# Patient Record
Sex: Female | Born: 1990 | Race: Black or African American | Hispanic: No | Marital: Single | Smoking: Current every day smoker
Health system: Southern US, Community
[De-identification: ages and names within clinical notes are randomized; demographics above are authoritative.]

---

## 2013-04-10 ENCOUNTER — Encounter (HOSPITAL_COMMUNITY): Payer: Self-pay | Admitting: *Deleted

## 2013-04-10 ENCOUNTER — Emergency Department (HOSPITAL_COMMUNITY)
Admission: EM | Admit: 2013-04-10 | Discharge: 2013-04-10 | Disposition: A | Discharge status: Home / Self Care | Payer: No Typology Code available for payment source | Attending: Emergency Medicine | Admitting: Emergency Medicine

## 2013-04-10 DIAGNOSIS — S8990XA Unspecified injury of unspecified lower leg, initial encounter: Secondary | ICD-10-CM | POA: Insufficient documentation

## 2013-04-10 DIAGNOSIS — IMO0002 Reserved for concepts with insufficient information to code with codable children: Secondary | ICD-10-CM | POA: Insufficient documentation

## 2013-04-10 DIAGNOSIS — Y92410 Unspecified street and highway as the place of occurrence of the external cause: Secondary | ICD-10-CM | POA: Insufficient documentation

## 2013-04-10 DIAGNOSIS — Y9389 Activity, other specified: Secondary | ICD-10-CM | POA: Insufficient documentation

## 2013-04-10 DIAGNOSIS — S99919A Unspecified injury of unspecified ankle, initial encounter: Secondary | ICD-10-CM | POA: Insufficient documentation

## 2013-04-10 MED ORDER — HYDROCODONE-ACETAMINOPHEN 5-325 MG PO TABS: 1.0000 | ORAL_TABLET | Freq: Four times a day (QID) | ORAL | Status: DC | PRN

## 2013-04-10 MED ORDER — DIAZEPAM 5 MG PO TABS: ORAL_TABLET | ORAL | Status: DC
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
## 2013-04-13 NOTE — ED Provider Notes (Signed)
Medical screening examination/treatment/procedure(s) were performed by non-physician practitioner and as supervising physician I was immediately available for consultation/collaboration.   Ashby Dawes, MD 04/13/13 [phone]

## 2014-05-09 ENCOUNTER — Emergency Department (HOSPITAL_COMMUNITY)
Admission: EM | Admit: 2014-05-09 | Discharge: 2014-05-09 | Disposition: A | Discharge status: Home / Self Care | Payer: No Typology Code available for payment source | Attending: Emergency Medicine | Admitting: Emergency Medicine

## 2014-05-09 ENCOUNTER — Encounter (HOSPITAL_COMMUNITY): Payer: Self-pay | Admitting: Emergency Medicine

## 2014-05-09 ENCOUNTER — Emergency Department (HOSPITAL_COMMUNITY)
Admission: EM | Admit: 2014-05-09 | Discharge: 2014-05-09 | Discharge status: Against Medical Advice | Payer: No Typology Code available for payment source | Attending: Emergency Medicine | Admitting: Emergency Medicine

## 2014-05-09 DIAGNOSIS — A499 Bacterial infection, unspecified: Secondary | ICD-10-CM | POA: Insufficient documentation

## 2014-05-09 DIAGNOSIS — Z202 Contact with and (suspected) exposure to infections with a predominantly sexual mode of transmission: Secondary | ICD-10-CM

## 2014-05-09 DIAGNOSIS — F17200 Nicotine dependence, unspecified, uncomplicated: Secondary | ICD-10-CM | POA: Insufficient documentation

## 2014-05-09 DIAGNOSIS — Z3202 Encounter for pregnancy test, result negative: Secondary | ICD-10-CM | POA: Insufficient documentation

## 2014-05-09 DIAGNOSIS — R05 Cough: Secondary | ICD-10-CM

## 2014-05-09 DIAGNOSIS — N898 Other specified noninflammatory disorders of vagina: Secondary | ICD-10-CM | POA: Insufficient documentation

## 2014-05-09 DIAGNOSIS — N760 Acute vaginitis: Secondary | ICD-10-CM | POA: Insufficient documentation

## 2014-05-09 DIAGNOSIS — R110 Nausea: Secondary | ICD-10-CM | POA: Insufficient documentation

## 2014-05-09 DIAGNOSIS — B9689 Other specified bacterial agents as the cause of diseases classified elsewhere: Secondary | ICD-10-CM | POA: Insufficient documentation

## 2014-05-09 DIAGNOSIS — R1011 Right upper quadrant pain: Secondary | ICD-10-CM | POA: Insufficient documentation

## 2014-05-09 DIAGNOSIS — R197 Diarrhea, unspecified: Secondary | ICD-10-CM | POA: Insufficient documentation

## 2014-05-09 DIAGNOSIS — T361X5A Adverse effect of cephalosporins and other beta-lactam antibiotics, initial encounter: Secondary | ICD-10-CM | POA: Insufficient documentation

## 2014-05-09 DIAGNOSIS — R059 Cough, unspecified: Secondary | ICD-10-CM

## 2014-05-09 LAB — URINALYSIS, ROUTINE W REFLEX MICROSCOPIC
Bilirubin Urine: NEGATIVE
Glucose, UA: NEGATIVE mg/dL
HGB URINE DIPSTICK: NEGATIVE
Ketones, ur: NEGATIVE mg/dL
Leukocytes, UA: NEGATIVE
Nitrite: NEGATIVE
PH: 8.5 — AB (ref 5.0–8.0)
Protein, ur: NEGATIVE mg/dL
SPECIFIC GRAVITY, URINE: 1.019 (ref 1.005–1.030)
UROBILINOGEN UA: 0.2 mg/dL (ref 0.0–1.0)

## 2014-05-09 LAB — WET PREP, GENITAL
TRICH WET PREP: NONE SEEN
Yeast Wet Prep HPF POC: NONE SEEN

## 2014-05-09 LAB — PREGNANCY, URINE: PREG TEST UR: NEGATIVE

## 2014-05-09 LAB — HIV ANTIBODY (ROUTINE TESTING W REFLEX): HIV 1&2 Ab, 4th Generation: NONREACTIVE

## 2014-05-09 MED ORDER — METRONIDAZOLE 500 MG PO TABS: 500.0000 mg | ORAL_TABLET | Freq: Two times a day (BID) | ORAL | Status: DC

## 2014-05-09 MED ORDER — CEFTRIAXONE SODIUM 250 MG IJ SOLR
250.0000 mg | Freq: Once | INTRAMUSCULAR | Status: AC
  Administered 2014-05-09: 250 mg via INTRAMUSCULAR
  Filled 2014-05-09: qty 250

## 2014-05-09 MED ORDER — AZITHROMYCIN 250 MG PO TABS
1000.0000 mg | ORAL_TABLET | Freq: Once | ORAL | Status: AC
  Administered 2014-05-09: 1000 mg via ORAL
  Filled 2014-05-09: qty 4

## 2014-05-09 MED ORDER — ONDANSETRON 4 MG PO TBDP
8.0000 mg | ORAL_TABLET | Freq: Once | ORAL | Status: AC
  Administered 2014-05-09: 8 mg via ORAL
  Filled 2014-05-09: qty 2

## 2014-05-09 NOTE — ED Provider Notes (Signed)
CSN: [id number]     Arrival date & time 05/09/14  1109 History   First MD Initiated Contact with Patient 05/09/14 1120     Chief Complaint  Patient presents with  . Cough  . Vaginal Discharge     (Consider location/radiation/quality/duration/timing/severity/associated sxs/prior Treatment) HPI Comments: Pt comes with with 2 complaints. Pt first complaint is cough times one month no fever associated with cough. Denies productive cough. Is a smoker. States that she was also told that she was exposed to chlamydia and she is having vaginal discharge. Denies abdominal pain. Does have history of std a couple of years ago.  The history is provided by the patient. No language interpreter was used.    History reviewed. No pertinent past medical history. History reviewed. No pertinent past surgical history. No family history on file. History  Substance Use Topics  . Smoking status: Current Every Day Smoker -- 0.25 packs/day  . Smokeless tobacco: Not on file  . Alcohol Use: No   OB History   Grav Para Term Preterm Abortions TAB SAB Ect Mult Living                 Review of Systems  Constitutional: Negative.   Respiratory: Negative.   Cardiovascular: Negative.       Allergies  Review of patient's allergies indicates no known allergies.  Home Medications   Prior to Admission medications   Not on File   BP 135/72  Pulse 70  Temp(Src) 98.2 F (36.8 C)  Resp 16  SpO2 98% Physical Exam  Nursing note and vitals reviewed. Constitutional: She is oriented to person, place, and time. She appears well-developed and well-nourished.  HENT:  Head: Normocephalic and atraumatic.  Right Ear: External ear normal.  Left Ear: External ear normal.  Cardiovascular: Normal rate and regular rhythm.   Pulmonary/Chest: Effort normal and breath sounds normal.  Abdominal: Soft. Bowel sounds are normal. There is no tenderness.  Genitourinary:  Clear vaginal discharge.no cmt  Musculoskeletal:  Normal range of motion.  Neurological: She is alert and oriented to person, place, and time. Coordination normal.  Skin: Skin is warm and dry.    ED Course  Procedures (including critical care time) Labs Review Labs Reviewed  WET PREP, GENITAL - Abnormal; Notable for the following:    Clue Cells Wet Prep HPF POC FEW (*)    WBC, Wet Prep HPF POC FEW (*)    All other components within normal limits  URINALYSIS, ROUTINE W REFLEX MICROSCOPIC - Abnormal; Notable for the following:    APPearance HAZY (*)    pH 8.5 (*)    All other components within normal limits  GC/CHLAMYDIA PROBE AMP  PREGNANCY, URINE  HIV ANTIBODY (ROUTINE TESTING)    Imaging Review No results found.   EKG Interpretation None      MDM   Final diagnoses:  BV (bacterial vaginosis)  STD exposure  Cough    Will treat for bv with flagyl. Pt given zithromax and rocpehin as known exposure. Pt is okay to follow up as needed.    Teressa LowerVrinda Khai Arrona, NP 05/09/14 [phone]

## 2014-05-09 NOTE — ED Notes (Addendum)
Cough x a few weeks worse at night also states wants to be checked for std partner states she has chlymdia

## 2014-05-09 NOTE — ED Notes (Signed)
Pt called for room X 3. No answer.

## 2014-05-09 NOTE — Discharge Instructions (Signed)
Bacterial Vaginosis Bacterial vaginosis is a vaginal infection that occurs when the normal balance of bacteria in the vagina is disrupted. It results from an overgrowth of certain bacteria. This is the most common vaginal infection in women of childbearing age. Treatment is important to prevent complications, especially in pregnant women, as it can cause a premature delivery. CAUSES  Bacterial vaginosis is caused by an increase in harmful bacteria that are normally present in smaller amounts in the vagina. Several different kinds of bacteria can cause bacterial vaginosis. However, the reason that the condition develops is not fully understood. RISK FACTORS Certain activities or behaviors can put you at an increased risk of developing bacterial vaginosis, including:  Having a new sex partner or multiple sex partners.  Douching.  Using an intrauterine device (IUD) for contraception. Women do not get bacterial vaginosis from toilet seats, bedding, swimming pools, or contact with objects around them. SIGNS AND SYMPTOMS  Some women with bacterial vaginosis have no signs or symptoms. Common symptoms include:  Grey vaginal discharge.  A fishlike odor with discharge, especially after sexual intercourse.  Itching or burning of the vagina and vulva.  Burning or pain with urination. DIAGNOSIS  Your health care provider will take a medical history and examine the vagina for signs of bacterial vaginosis. A sample of vaginal fluid may be taken. Your health care provider will look at this sample under a microscope to check for bacteria and abnormal cells. A vaginal pH test may also be done.  TREATMENT  Bacterial vaginosis may be treated with antibiotic medicines. These may be given in the form of a pill or a vaginal cream. A second round of antibiotics may be prescribed if the condition comes back after treatment.  HOME CARE INSTRUCTIONS   Only take over-the-counter or prescription medicines as  directed by your health care provider.  If antibiotic medicine was prescribed, take it as directed. Make sure you finish it even if you start to feel better.  Do not have sex until treatment is completed.  Tell all sexual partners that you have a vaginal infection. They should see their health care provider and be treated if they have problems, such as a mild rash or itching.  Practice safe sex by using condoms and only having one sex partner. SEEK MEDICAL CARE IF:   Your symptoms are not improving after 3 days of treatment.  You have increased discharge or pain.  You have a fever. MAKE SURE YOU:   Understand these instructions.  Will watch your condition.  Will get help right away if you are not doing well or get worse. FOR MORE INFORMATION  Centers for Disease Control and Prevention, Division of STD Prevention: www.cdc.gov/std American Sexual Health Association (ASHA): www.ashastd.org  Document Released: 09/12/2005 Document Revised: 07/03/2013 Document Reviewed: 04/24/2013 ExitCare Patient Information 2015 [employer]. This information is not intended to replace advice given to you by your health care provider. Make sure you discuss any questions you have with your health care provider.  

## 2014-05-09 NOTE — ED Notes (Signed)
Pt just here and given a Rocephin injection approx 20-30 mins ago and since has developed nausea, lower abd cramping, and diarrhea

## 2014-05-10 LAB — GC/CHLAMYDIA PROBE AMP
CT PROBE, AMP APTIMA: NEGATIVE
GC Probe RNA: NEGATIVE

## 2014-05-10 NOTE — ED Provider Notes (Signed)
Medical screening examination/treatment/procedure(s) were performed by non-physician practitioner and as supervising physician I was immediately available for consultation/collaboration.   Aulden Calise T Antwan Bribiesca, MD 05/10/14 1448 

## 2014-06-08 ENCOUNTER — Emergency Department (HOSPITAL_COMMUNITY)
Admission: EM | Admit: 2014-06-08 | Discharge: 2014-06-08 | Disposition: A | Discharge status: Home / Self Care | Payer: No Typology Code available for payment source | Attending: Emergency Medicine | Admitting: Emergency Medicine

## 2014-06-08 ENCOUNTER — Encounter (HOSPITAL_COMMUNITY): Payer: Self-pay | Admitting: Emergency Medicine

## 2014-06-08 DIAGNOSIS — H109 Unspecified conjunctivitis: Secondary | ICD-10-CM | POA: Insufficient documentation

## 2014-06-08 DIAGNOSIS — F17200 Nicotine dependence, unspecified, uncomplicated: Secondary | ICD-10-CM | POA: Insufficient documentation

## 2014-06-08 DIAGNOSIS — H5710 Ocular pain, unspecified eye: Secondary | ICD-10-CM | POA: Insufficient documentation

## 2014-06-08 DIAGNOSIS — Z792 Long term (current) use of antibiotics: Secondary | ICD-10-CM | POA: Insufficient documentation

## 2014-06-08 MED ORDER — POLYMYXIN B-TRIMETHOPRIM 10000-0.1 UNIT/ML-% OP SOLN: 1.0000 [drp] | OPHTHALMIC | Status: AC

## 2014-06-08 MED ORDER — TETRACAINE HCL 0.5 % OP SOLN: 2.0000 [drp] | Freq: Once | OPHTHALMIC | Status: DC

## 2014-06-08 MED ORDER — FLUORESCEIN SODIUM 1 MG OP STRP: 1.0000 | ORAL_STRIP | Freq: Once | OPHTHALMIC | Status: DC

## 2014-06-08 NOTE — ED Provider Notes (Signed)
CSN: [id number]     Arrival date & time 06/08/14  1518 History   First MD Initiated Contact with Patient 06/08/14 1658     This chart was scribed for non-physician practitioner, Trixie Dredge PA-C working with Elwin Mocha, MD by Arlan Organ, ED Scribe. This patient was seen in room TR04C/TR04C and the patient's care was started at 5:24 PM.   Chief Complaint  Patient presents with  . Eye Pain   The history is provided by the patient. No language interpreter was used.    HPI Comments: Kendra Vargas is a 23 y.o. female who presents to the Emergency Department complaining of constant, moderate R eye discomfort x 7 days. Pt also reports new onset discomfort to the L eye in last 2 days that is exactly the same feeling she had when the right eye symptoms started. Pt also reports associated redness to the eyes. She admits to crusting and drainage from eyes after waking from sleep. She denies any recent injury or trauma to the eyes or face. She has tried OTC Allergy drops without any improvement for symptoms. No fever, congestion, rhinorrhea, otalgia, or chill. No recent contact with individuals with Conjunctivitis. No known allergies to medications. No other concerns this visit.  She does not wear contact lenses.   History reviewed. No pertinent past medical history. History reviewed. No pertinent past surgical history. No family history on file. History  Substance Use Topics  . Smoking status: Current Every Day Smoker -- 0.25 packs/day  . Smokeless tobacco: Not on file  . Alcohol Use: No   OB History   Grav Para Term Preterm Abortions TAB SAB Ect Mult Living                 Review of Systems  Constitutional: Negative for fever and chills.  HENT: Negative for congestion, postnasal drip and rhinorrhea.   Eyes: Positive for pain, discharge and redness. Negative for photophobia and visual disturbance.  Respiratory: Negative for cough.   All other systems reviewed and are  negative.     Allergies  Review of patient's allergies indicates no known allergies.  Home Medications   Prior to Admission medications   Medication Sig Start Date End Date Taking? Authorizing Provider  metroNIDAZOLE (FLAGYL) 500 MG tablet Take 1 tablet (500 mg total) by mouth 2 (two) times daily. 05/09/14   Teressa Lower, NP   Triage Vitals: BP 151/76  Pulse 83  Temp(Src) 97.6 F (36.4 C) (Oral)  Resp 18  Ht  (1.6 m)  Wt 150 lb (68.04 kg)  BMI 26.58 kg/m2  SpO2 100%  LMP 05/21/2014   Physical Exam  Nursing note and vitals reviewed. Constitutional: She appears well-developed and well-nourished. No distress.  HENT:  Head: Normocephalic and atraumatic.  Eyes: EOM and lids are normal. Pupils are equal, round, and reactive to light. Right eye exhibits no discharge. No foreign body present in the right eye. Left eye exhibits no discharge. No foreign body present in the left eye. Right conjunctiva is injected. Left conjunctiva is not injected.  Neck: Neck supple.  Pulmonary/Chest: Effort normal.  Neurological: She is alert.  Skin: She is not diaphoretic.    ED Course  Procedures (including critical care time)  DIAGNOSTIC STUDIES: Oxygen Saturation is 100% on RA, Normal by my interpretation.    COORDINATION OF CARE: 5:25 PM- Will prescribed course of antibiotic eye drops at discharge. Discussed treatment plan with pt at bedside and pt agreed to plan.  Labs Review Labs Reviewed - No data to display  Imaging Review No results found.   EKG Interpretation None      MDM   Final diagnoses:  Conjunctivitis of right eye    Afebrile, nontoxic patient with 7 days of right eye irritation, injection, tearing, crusting.  New symptoms starting on left.  No injury or FB to the eye, no contact lenses.  She did have a URI recently but it has completely resolved.  No other known exposures.   D/C home with polytrim ophthalmic solution.  Discussed result, findings,  treatment, and follow up  with patient.  Pt given return precautions.  Pt verbalizes understanding and agrees with plan.       I personally performed the services described in this documentation, which was scribed in my presence. The recorded information has been reviewed and is accurate.    Trixie Dredge, PA-C 06/08/14 [phone]

## 2014-06-08 NOTE — ED Notes (Signed)
Declined W/C at D/C and was escorted to lobby by RN. 

## 2014-06-08 NOTE — Discharge Instructions (Signed)
Read the information below.  Use the prescribed medication as directed.  Please discuss all new medications with your pharmacist.  You may return to the Emergency Department at any time for worsening condition or any new symptoms that concern you.     If you develop worsening pain in your eye, change in your vision, swelling around your eye, difficulty moving your eye, or fevers greater than 100.4, see your eye doctor or return to the Emergency Department immediately for a recheck.    ° ° °Conjunctivitis °Conjunctivitis is commonly called "pink eye." Conjunctivitis can be caused by bacterial or viral infection, allergies, or injuries. There is usually redness of the lining of the eye, itching, discomfort, and sometimes discharge. There may be deposits of matter along the eyelids. A viral infection usually causes a watery discharge, while a bacterial infection causes a yellowish, thick discharge. Pink eye is very contagious and spreads by direct contact. °You may be given antibiotic eyedrops as part of your treatment. Before using your eye medicine, remove all drainage from the eye by washing gently with warm water and cotton balls. Continue to use the medication until you have awakened 2 mornings in a row without discharge from the eye. Do not rub your eye. This increases the irritation and helps spread infection. Use separate towels from other household members. Wash your hands with soap and water before and after touching your eyes. Use cold compresses to reduce pain and sunglasses to relieve irritation from light. Do not wear contact lenses or wear eye makeup until the infection is gone. °SEEK MEDICAL CARE IF:  °· Your symptoms are not better after 3 days of treatment. °· You have increased pain or trouble seeing. °· The outer eyelids become very red or swollen. °Document Released: 10/20/2004 Document Revised: 12/05/2011 Document Reviewed: 09/12/2005 °ExitCare® Patient Information ©2015 [employer]. This  information is not intended to replace advice given to you by your health care provider. Make sure you discuss any questions you have with your health care provider. ° °

## 2014-06-08 NOTE — ED Provider Notes (Signed)
Medical screening examination/treatment/procedure(s) were performed by non-physician practitioner and as supervising physician I was immediately available for consultation/collaboration.   EKG Interpretation None        Mariavictoria Nottingham, MD 06/08/14 2315 

## 2014-06-08 NOTE — ED Notes (Signed)
The pt is c/o rt eye pain redness and itching for 7 days.  Her lt eye has started to get red also

## 2014-06-18 ENCOUNTER — Encounter (HOSPITAL_COMMUNITY): Payer: Self-pay | Admitting: Emergency Medicine

## 2014-06-18 ENCOUNTER — Emergency Department (HOSPITAL_COMMUNITY): Payer: Self-pay

## 2014-06-18 ENCOUNTER — Emergency Department (HOSPITAL_COMMUNITY)
Admission: EM | Admit: 2014-06-18 | Discharge: 2014-06-18 | Disposition: A | Discharge status: Home / Self Care | Payer: Self-pay | Attending: Emergency Medicine | Admitting: Emergency Medicine

## 2014-06-18 DIAGNOSIS — R0781 Pleurodynia: Secondary | ICD-10-CM

## 2014-06-18 DIAGNOSIS — F17200 Nicotine dependence, unspecified, uncomplicated: Secondary | ICD-10-CM | POA: Insufficient documentation

## 2014-06-18 DIAGNOSIS — R059 Cough, unspecified: Secondary | ICD-10-CM | POA: Insufficient documentation

## 2014-06-18 DIAGNOSIS — R05 Cough: Secondary | ICD-10-CM | POA: Insufficient documentation

## 2014-06-18 DIAGNOSIS — R091 Pleurisy: Secondary | ICD-10-CM | POA: Insufficient documentation

## 2014-06-18 DIAGNOSIS — R0789 Other chest pain: Secondary | ICD-10-CM | POA: Insufficient documentation

## 2014-06-18 MED ORDER — MELOXICAM 15 MG PO TABS: 15.0000 mg | ORAL_TABLET | Freq: Every day | ORAL | Status: AC

## 2014-06-18 NOTE — ED Notes (Signed)
Patient returned from X-ray 

## 2014-06-18 NOTE — ED Notes (Signed)
Pt reports she was seen here month ago and dx with pleurisy and cough. Was supposed to get xray for follow up and did not. Pain worsening especially with deep breathes and laughing and moving. Takes tylenol sometimes and says it is helpful and does a steam in the bathroom.

## 2014-06-18 NOTE — Discharge Instructions (Signed)
Chest Wall Pain °Chest wall pain is pain felt in or around the chest bones and muscles. It may take up to 6 weeks to get better. It may take longer if you are active. Chest wall pain can happen on its own. Other times, things like germs, injury, coughing, or exercise can cause the pain. °HOME CARE  °· Avoid activities that make you tired or cause pain. Try not to use your chest, belly (abdominal), or side muscles. Do not use heavy weights. °· Put ice on the sore area. °¨ Put ice in a plastic bag. °¨ Place a towel between your skin and the bag. °¨ Leave the ice on for 15-20 minutes for the first 2 days. °· Only take medicine as told by your doctor. °GET HELP RIGHT AWAY IF:  °· You have more pain or are very uncomfortable. °· You have a fever. °· Your chest pain gets worse. °· You have new problems. °· You feel sick to your stomach (nauseous) or throw up (vomit). °· You start to sweat or feel lightheaded. °· You have a cough with mucus (phlegm). °· You cough up blood. °MAKE SURE YOU:  °· Understand these instructions. °· Will watch your condition. °· Will get help right away if you are not doing well or get worse. °Document Released: 02/29/2008 Document Revised: 12/05/2011 Document Reviewed: 05/09/2011 °ExitCare® Patient Information ©2015 [employer]. This information is not intended to replace advice given to you by your health care provider. Make sure you discuss any questions you have with your health care provider. ° °

## 2014-06-18 NOTE — ED Provider Notes (Signed)
CSN: [id number]     Arrival date & time 06/18/14  2025 History  This chart was scribed for non-physician practitioner, Junius Finner, PA-C,working with Richardean Canal, MD, by Karle Plumber, ED Scribe. This patient was seen in room TR04C/TR04C and the patient's care was started at 10:27 PM.  Chief Complaint  Patient presents with  . Pleurisy   The history is provided by the patient. No language interpreter was used.   HPI Comments:  Kendra Vargas is a 23 y.o. female who presents to the Emergency Department complaining of moderate, sharp left side pain below her ribs onset one month ago. Pt states the pain was only when she coughed but now is constant. She states she fell earlier today, but states the pain has been present before. She reports the pain as 10/10. Pt states she has taken Ibuprofen and Excedrin Migraine with minimal relief of the pain. She denies fever, nausea, vomiting or back pain. Pt is a smoker of about one pack per week. No h/o asthma.   History reviewed. No pertinent past medical history. History reviewed. No pertinent past surgical history. History reviewed. No pertinent family history. History  Substance Use Topics  . Smoking status: Current Every Day Smoker -- 0.25 packs/day  . Smokeless tobacco: Not on file  . Alcohol Use: No   OB History   Grav Para Term Preterm Abortions TAB SAB Ect Mult Living                 Review of Systems  Respiratory: Positive for cough.   All other systems reviewed and are negative.   Allergies  Review of patient's allergies indicates no known allergies.  Home Medications   Prior to Admission medications   Medication Sig Start Date End Date Taking? Authorizing Provider  meloxicam (MOBIC) 15 MG tablet Take 1 tablet (15 mg total) by mouth daily. Take daily for 5 days, then daily as needed for pain. 06/18/14   Junius Finner, PA-C  trimethoprim-polymyxin b (POLYTRIM) ophthalmic solution Place 1 drop into both eyes every 4 (four)  hours. X 7 days 06/08/14   Trixie Dredge, PA-C   Triage Vitals: BP 125/56  Pulse 101  Temp(Src) 98.2 F (36.8 C) (Oral)  Resp 18  Ht  (1.6 m)  Wt 150 lb (68.04 kg)  BMI 26.58 kg/m2  SpO2 99%  LMP 05/21/2014 Physical Exam  Nursing note and vitals reviewed. Constitutional: She is oriented to person, place, and time. She appears well-developed and well-nourished.  Pt lying on exam bed, texting on phone. NAD  HENT:  Head: Normocephalic and atraumatic.  Eyes: EOM are normal.  Neck: Normal range of motion.  Cardiovascular: Normal rate, regular rhythm and normal heart sounds.  Exam reveals no gallop and no friction rub.   No murmur heard. Pulmonary/Chest: Effort normal and breath sounds normal. No respiratory distress. She has no wheezes. She has no rales. She exhibits tenderness (tender to left lower ribs, nop crepitus).  Abdominal: Soft. She exhibits no distension and no mass. There is no tenderness. There is no rebound and no guarding.  Musculoskeletal: Normal range of motion.  Neurological: She is alert and oriented to person, place, and time.  Skin: Skin is warm and dry. No rash noted. No erythema.  No erythema or ecchymosis. No rash or lesions noted to abdomen or chest wall.  Psychiatric: She has a normal mood and affect. Her behavior is normal.    ED Course  Procedures (including critical care time) DIAGNOSTIC  STUDIES: Oxygen Saturation is 99% on RA, normal by my interpretation.   COORDINATION OF CARE: 10:31 PM- Informed pt that her CXR was normal. Will prescribe NSAID and refer to outpatient clinic for pt to follow up. Pt verbalizes understanding and agrees to plan.  Medications - No data to display  Labs Review Labs Reviewed - No data to display  Imaging Review Dg Chest 2 View  06/18/2014   CLINICAL DATA:  Chest pain.  EXAM: CHEST  2 VIEW  COMPARISON:  None.  FINDINGS: The heart size and mediastinal contours are within normal limits. Both lungs are clear. The  visualized skeletal structures are unremarkable.  IMPRESSION: Normal chest x-ray.   Electronically Signed   By: Loralie Champagne M.D.   On: 06/18/2014 22:18     EKG Interpretation None      MDM   Final diagnoses:  Rib pain on left side   Pt c/o left lower rib pain, mild tenderness with palpation. No respiratory distress. Lungs: CTAB.  CXR: normal CXR.  Will tx conservatively with mobic. Also encouraged alternating warm and cool compresses. Advised to f/u with a PCP in 1 week if not improving. Return precautions provided. Pt verbalized understanding and agreement with tx plan.   I personally performed the services described in this documentation, which was scribed in my presence. The recorded information has been reviewed and is accurate.    Junius Finner, PA-C 06/19/14 1625

## 2014-06-21 NOTE — ED Provider Notes (Signed)
Medical screening examination/treatment/procedure(s) were performed by non-physician practitioner and as supervising physician I was immediately available for consultation/collaboration.   EKG Interpretation None        Richardean Canal, MD 06/21/14 [phone]

## 2015-11-12 IMAGING — CR DG CHEST 2V
2 series · 2 of 2 positions shown · non-contrast
Comparison: None.

CLINICAL DATA: Chest pain.

EXAM:
CHEST  2 VIEW

[w chest lat *]
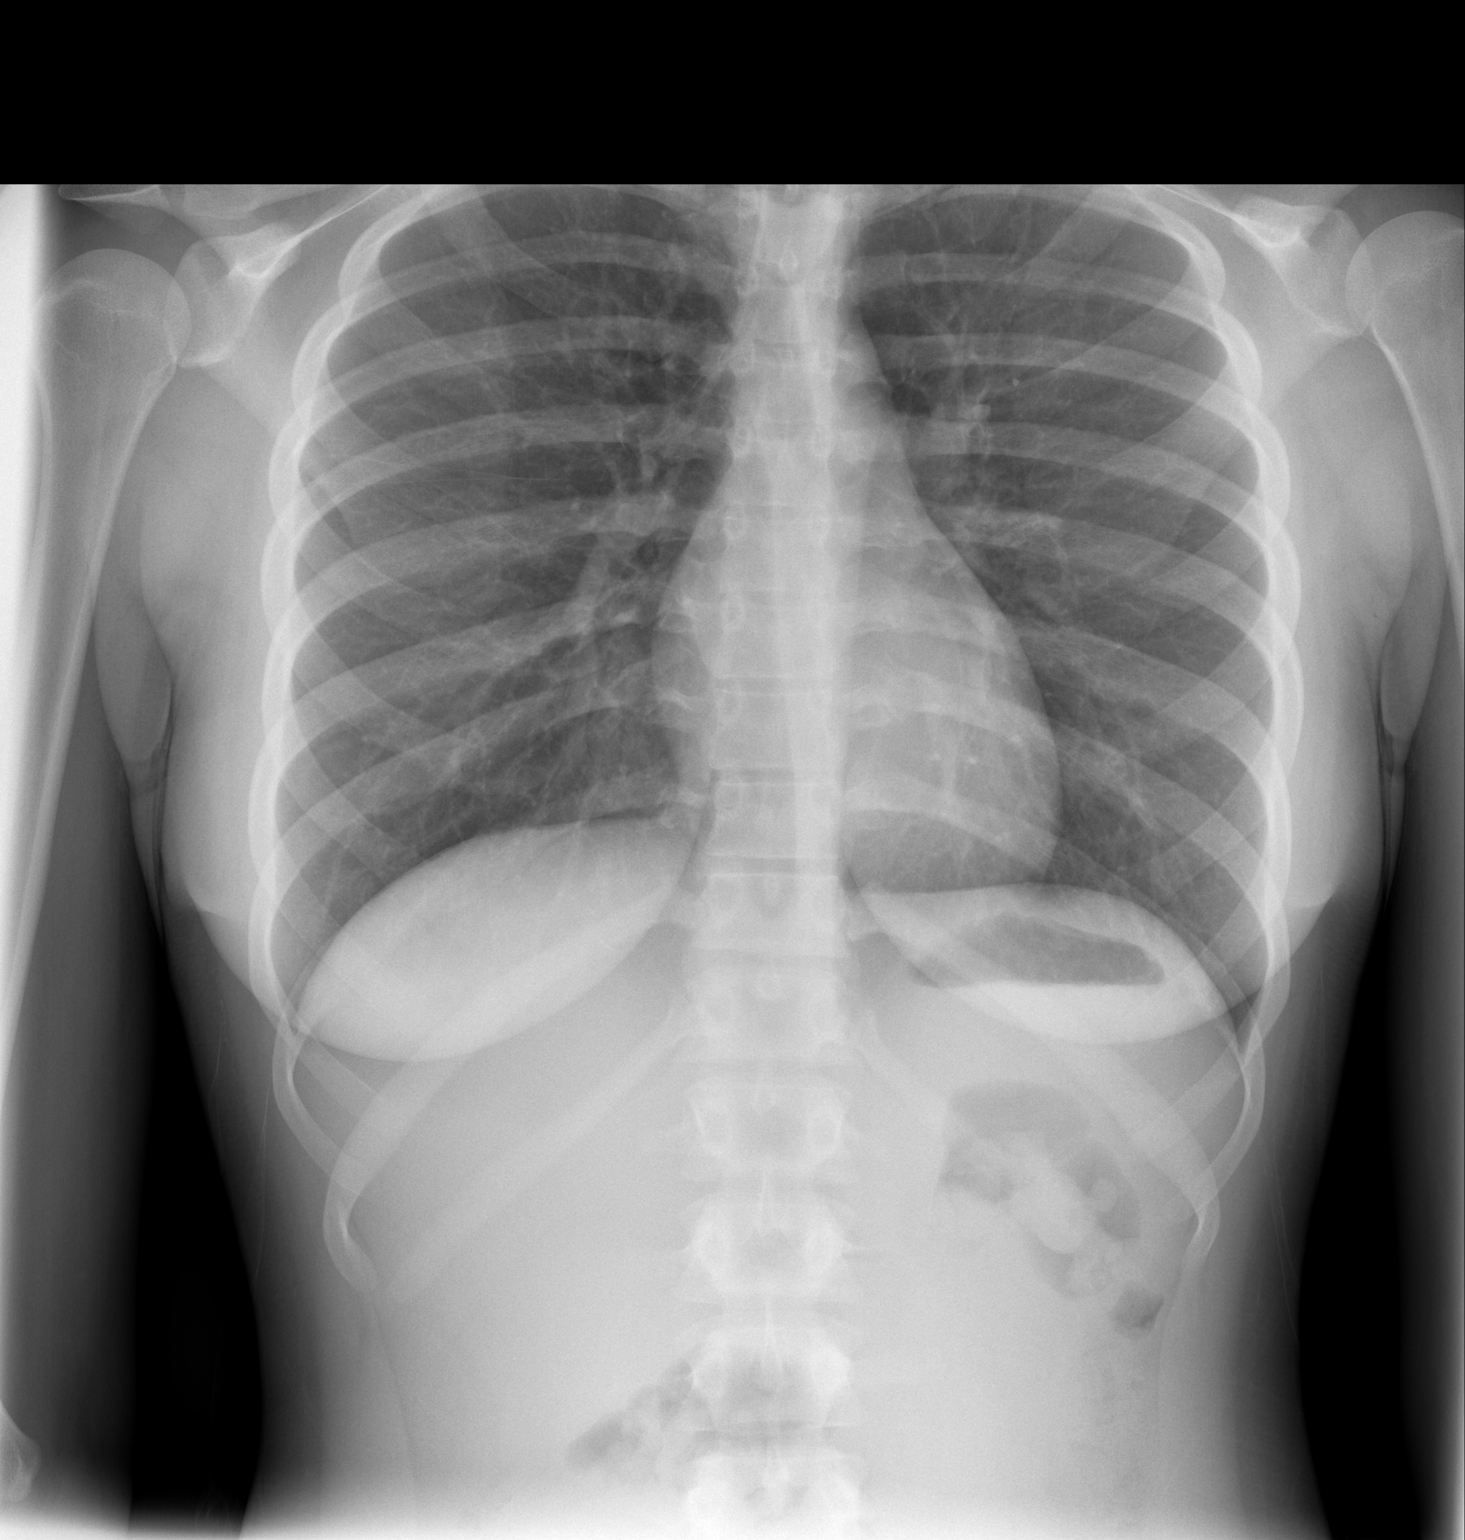

[w chest ap *]
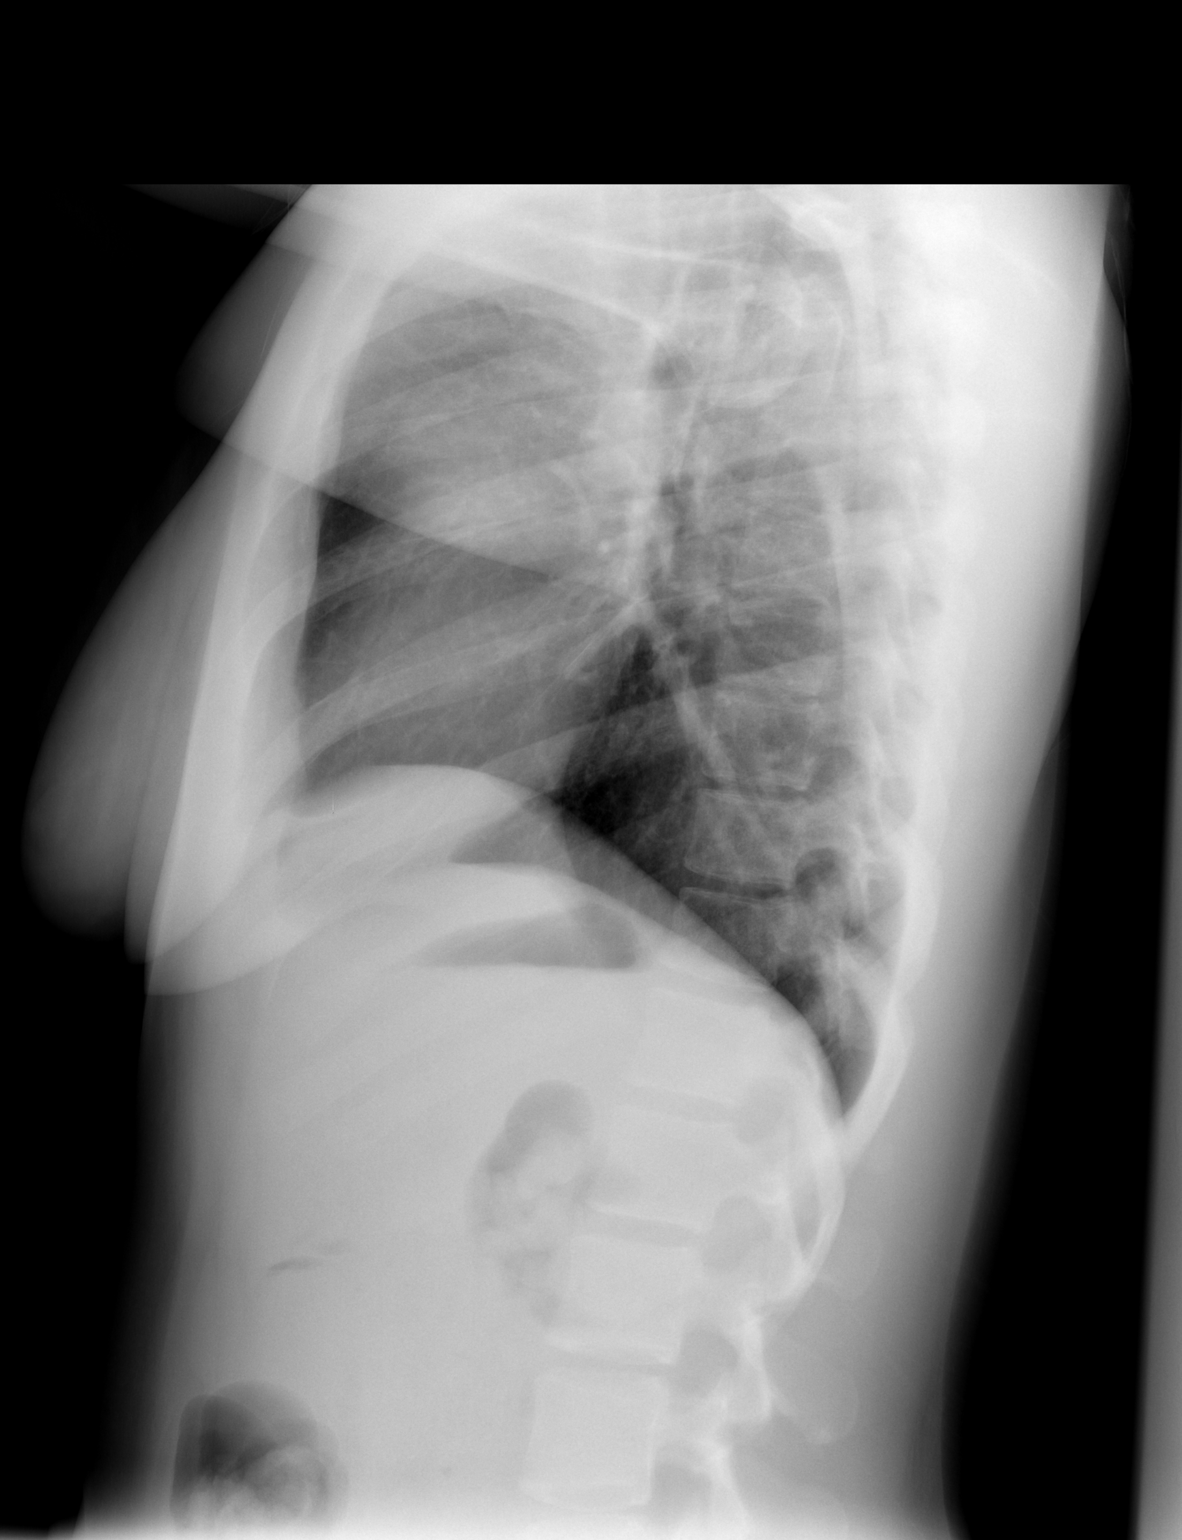

[2 of 2 positions shown; findings below may reference images not displayed]

FINDINGS: The heart size and mediastinal contours are within normal limits.
Both lungs are clear. The visualized skeletal structures are
unremarkable.
IMPRESSION: Normal chest x-ray.
